# Patient Record
Sex: Female | Born: 1970 | Race: Black or African American | Hispanic: No | Smoking: Never smoker
Health system: Southern US, Community
[De-identification: ages and names within clinical notes are randomized; demographics above are authoritative.]

---

## 1999-11-13 ENCOUNTER — Encounter: Payer: Self-pay | Admitting: Emergency Medicine

## 1999-11-13 ENCOUNTER — Emergency Department (HOSPITAL_COMMUNITY): Admission: EM | Admit: 1999-11-13 | Discharge: 1999-11-13 | Payer: Self-pay | Admitting: Emergency Medicine

## 2006-10-04 ENCOUNTER — Emergency Department (HOSPITAL_COMMUNITY): Admission: EM | Admit: 2006-10-04 | Discharge: 2006-10-04 | Payer: Self-pay | Admitting: Emergency Medicine

## 2006-10-09 ENCOUNTER — Emergency Department (HOSPITAL_COMMUNITY): Admission: EM | Admit: 2006-10-09 | Discharge: 2006-10-09 | Payer: Self-pay | Admitting: Emergency Medicine

## 2007-02-28 ENCOUNTER — Emergency Department (HOSPITAL_COMMUNITY): Admission: RE | Admit: 2007-02-28 | Discharge: 2007-02-28 | Payer: Self-pay | Admitting: Family Medicine

## 2008-04-11 ENCOUNTER — Emergency Department (HOSPITAL_COMMUNITY): Admission: EM | Admit: 2008-04-11 | Discharge: 2008-04-11 | Payer: Self-pay | Admitting: Emergency Medicine

## 2009-01-02 ENCOUNTER — Emergency Department (HOSPITAL_COMMUNITY): Admission: EM | Admit: 2009-01-02 | Discharge: 2009-01-03 | Payer: Self-pay | Admitting: Emergency Medicine

## 2009-04-16 ENCOUNTER — Emergency Department (HOSPITAL_COMMUNITY): Admission: EM | Admit: 2009-04-16 | Discharge: 2009-04-16 | Payer: Self-pay | Admitting: Emergency Medicine

## 2009-10-13 ENCOUNTER — Emergency Department (HOSPITAL_COMMUNITY): Admission: EM | Admit: 2009-10-13 | Discharge: 2009-10-13 | Payer: Self-pay | Admitting: Emergency Medicine

## 2010-04-22 ENCOUNTER — Observation Stay (HOSPITAL_COMMUNITY): Admission: EM | Admit: 2010-04-22 | Discharge: 2010-04-22 | Payer: Self-pay | Admitting: Emergency Medicine

## 2010-04-22 ENCOUNTER — Encounter (INDEPENDENT_AMBULATORY_CARE_PROVIDER_SITE_OTHER): Payer: Self-pay | Admitting: Emergency Medicine

## 2010-04-22 ENCOUNTER — Ambulatory Visit: Payer: Self-pay | Admitting: Vascular Surgery

## 2010-06-15 ENCOUNTER — Ambulatory Visit: Payer: Self-pay | Admitting: Diagnostic Radiology

## 2010-06-15 ENCOUNTER — Emergency Department (HOSPITAL_BASED_OUTPATIENT_CLINIC_OR_DEPARTMENT_OTHER): Admission: EM | Admit: 2010-06-15 | Discharge: 2010-06-15 | Payer: Self-pay | Admitting: Emergency Medicine

## 2010-06-25 ENCOUNTER — Emergency Department (HOSPITAL_BASED_OUTPATIENT_CLINIC_OR_DEPARTMENT_OTHER)
Admission: EM | Admit: 2010-06-25 | Discharge: 2010-06-25 | Payer: Self-pay | Source: Home / Self Care | Admitting: Emergency Medicine

## 2010-06-29 ENCOUNTER — Ambulatory Visit: Payer: Self-pay | Admitting: Cardiology

## 2010-07-07 ENCOUNTER — Telehealth: Payer: Self-pay | Admitting: Cardiology

## 2010-07-19 ENCOUNTER — Ambulatory Visit: Payer: Self-pay | Admitting: Cardiology

## 2010-07-19 ENCOUNTER — Encounter: Payer: Self-pay | Admitting: Cardiology

## 2010-07-19 DIAGNOSIS — I498 Other specified cardiac arrhythmias: Secondary | ICD-10-CM

## 2010-07-19 DIAGNOSIS — R079 Chest pain, unspecified: Secondary | ICD-10-CM | POA: Insufficient documentation

## 2010-08-04 ENCOUNTER — Ambulatory Visit (HOSPITAL_COMMUNITY): Admission: RE | Admit: 2010-08-04 | Discharge: 2010-08-04 | Payer: Self-pay | Admitting: Cardiology

## 2010-08-04 ENCOUNTER — Ambulatory Visit: Payer: Self-pay

## 2010-08-04 ENCOUNTER — Ambulatory Visit: Payer: Self-pay | Admitting: Cardiology

## 2010-08-04 ENCOUNTER — Telehealth: Payer: Self-pay | Admitting: Cardiology

## 2010-08-04 ENCOUNTER — Encounter: Payer: Self-pay | Admitting: Cardiology

## 2010-08-11 ENCOUNTER — Telehealth (INDEPENDENT_AMBULATORY_CARE_PROVIDER_SITE_OTHER): Payer: Self-pay | Admitting: Radiology

## 2010-08-15 ENCOUNTER — Encounter: Payer: Self-pay | Admitting: Cardiovascular Disease

## 2010-08-15 ENCOUNTER — Encounter (HOSPITAL_COMMUNITY)
Admission: RE | Admit: 2010-08-15 | Discharge: 2010-10-25 | Payer: Self-pay | Source: Home / Self Care | Attending: Cardiology | Admitting: Cardiology

## 2010-08-15 ENCOUNTER — Ambulatory Visit: Payer: Self-pay

## 2010-08-15 ENCOUNTER — Ambulatory Visit: Payer: Self-pay | Admitting: Cardiovascular Disease

## 2010-08-25 ENCOUNTER — Telehealth: Payer: Self-pay | Admitting: Cardiology

## 2010-09-01 ENCOUNTER — Emergency Department (HOSPITAL_BASED_OUTPATIENT_CLINIC_OR_DEPARTMENT_OTHER): Admission: EM | Admit: 2010-09-01 | Discharge: 2010-06-24 | Payer: Self-pay | Admitting: Emergency Medicine

## 2010-10-25 NOTE — Assessment & Plan Note (Signed)
Summary: Cardiology Nuclear Testing  Nuclear Med Background Indications for Stress Test: Evaluation for Ischemia, Post Hospital  Indications Comments: Emergency Dept. 9/21,9/30, and 06/25/10 with chest pain. Negative cardiac enzymes.  History: Echo, GXT  History Comments: 08/04/10- Nondiagnostic GXT due to inability to achieve target HR. 08/04/10- Echo- EF= 55-60%  Symptoms: Chest Tightness, DOE, Fatigue, Light-Headedness, Nausea, Palpitations, Vomiting    Nuclear Pre-Procedure Cardiac Risk Factors: Family History - CAD, Obesity, Smoker Caffeine/Decaff Intake: none NPO After: 7:00 PM Lungs: clear IV 0.9% NS with Angio Cath: 22g     IV Site: R Hand Chest Size (in) 36     Cup Size DD     Height (in): 67 Weight (lb): 206 BMI: 32.38  Nuclear Med Study 1 or 2 day study:  1 day     Stress Test Type:  Treadmill/Lexiscan Reading MD:  Marca Ancona, MD     Referring MD:  D.McLean Resting Radionuclide:  Technetium 27m Tetrofosmin     Resting Radionuclide Dose:  11 mCi  Stress Radionuclide:  Technetium 67m Tetrofosmin     Stress Radionuclide Dose:  32 mCi   Stress Protocol  Max Systolic BP: 163 mm Hg   Stress Test Technologist:  Milana Na, EMT-P     Nuclear Technologist:  Domenic Polite, CNMT  Rest Procedure  Myocardial perfusion imaging was performed at rest 45 minutes following the intravenous administration of Technetium 68m Tetrofosmin.  Stress Procedure  The patient received IV Lexiscan 0.4 mg over 15-seconds with concurrent low level exercise and then Technetium 53m Tetrofosmin was injected at 30-seconds while the patient continued walking one more minute.  There were no significant changes with Lexiscan.  Quantitative spect images were obtained after a 45 minute delay.  QPS Raw Data Images:  Normal; no motion artifact; normal heart/lung ratio. Stress Images:  Normal homogeneous uptake in all areas of the myocardium. Rest Images:  Normal homogeneous uptake in all  areas of the myocardium. Subtraction (SDS):  Normal Transient Ischemic Dilatation:  1.05  (Normal <1.22)  Lung/Heart Ratio:  0.27  (Normal <0.45)  Quantitative Gated Spect Images QGS EDV:  96 ml QGS ESV:  34 ml QGS EF:  65 % QGS cine images:  normal  Findings Normal nuclear study      Overall Impression  Exercise Capacity: Lexiscan with no exercise. BP Response: Normal blood pressure response. Clinical Symptoms: Dyspnea ECG Impression: No significant ST segment change suggestive of ischemia. Overall Impression: Normal stress nuclear study.  Appended Document: Cardiology Nuclear Testing normal study  Appended Document: Cardiology Nuclear Testing [facility]   Appended Document: Cardiology Nuclear Testing discussed results with pt by telephone--see phone note 08/25/10

## 2010-10-25 NOTE — Progress Notes (Signed)
Summary: Nuc Pre-Procedure  Phone Note Outgoing Call Call back at Home Phone [phone]   Call placed by: Henrine Screws Call placed to: Patient Reason for Call: Confirm/change Appt Summary of Call: Left message with information on Myoview Information Sheet (see scanned document for details).      Nuclear Med Background Indications for Stress Test: Evaluation for Ischemia, Post Hospital  Indications Comments: Emergency Dept. 9/21,9/30, and 06/25/10 with chest pain. Negative cardiac enzymes.  History: Echo, GXT  History Comments: 08/04/10- Nondiagnostic GXT due to inability to achieve target HR. 08/04/10- Echo- EF= 55-60%  Symptoms: Chest Tightness, DOE, Fatigue, Light-Headedness, Nausea, Palpitations, Vomiting    Nuclear Pre-Procedure Cardiac Risk Factors: Family History - CAD, Obesity, Smoker Height (in): 67

## 2010-10-25 NOTE — Progress Notes (Signed)
Summary: monitor results 06/29/10  Phone Note Outgoing Call   Call placed by: Katina Dung, RN, BSN,  July 07, 2010 4:16 PM Call placed to: Patient Summary of Call: monitor results  Follow-up for Phone Call        Dr Shirlee Latch reviewed monitor done 06/29/10(signed monitor report is on Anne Lankford's desk)-Dr McLean's interpretation-no worrisome events,HR in 40s at lowest--OK --[facility] Katina Dung, RN, BSN  July 07, 2010 4:17 PM I called [phone] and talked with pt's mother-this is not pt's phone number--she did not have pt's number but stated she would ask pt to call our office Katina Dung, RN, BSN  July 07, 2010 6:11 PM   Additional Follow-up for Phone Call Additional follow up Details #1::        pt returning call re results of monitor-pls call [phone] Glynda Jaeger  July 08, 2010 8:36 AM  Meredith Staggers, RN  July 08, 2010 9:02 AM   Pt. still having some CP & is extremely tired & is C/O headaches more frequently. Was seen in the ER for CP with negative CE. According to the pt, her HR drops when she stands up. Holter monitor showed lowest HR in the 40's. Does not have a f/u appt with Dr.Mclean. Will forward to his desktop to see if he wants her to f/u with him or primary. Whitney Maeola Sarah RN  July 08, 2010 9:30 AM  Additional Follow-up by: Whitney Maeola Sarah RN,  July 08, 2010 9:31 AM     Appended Document: monitor results 06/29/10 She can come for appointment with me.   Appended Document: monitor results 06/29/10 Left message with family member for pt to call & make an appt.   Appended Document: monitor results 06/29/10 pt's appt 10/25 with Mclean

## 2010-10-25 NOTE — Procedures (Signed)
Summary: summary report  summary report   Imported By: Mirna Mires 07/18/2010 16:08:53  _____________________________________________________________________  External Attachment:    Type:   Image     Comment:   External Document

## 2010-10-25 NOTE — Progress Notes (Signed)
Summary: pt rtn call  Phone Note Call from Patient Call back at [phone]   Caller: Patient Reason for Call: Talk to Nurse, Talk to Doctor Summary of Call: pt rtn call not sure who called or why Initial call taken by: Omer Jack,  August 25, 2010 8:23 AM  Follow-up for Phone Call        talked with pt about myoview results--pt states she continues to have chest pain--Dr Shirlee Latch reviewed echo and  myoview results-pt had chest xray 05/2010 at [facility]---he recommended pt try Omeprazole 20mg  and followup with her PCP--pt states she has a prescription for Omeprazole and has been using this daily--this does not seem to help her chest pain--pt states she is going to have to change her PCP--we dicussed referral to [facility] --pt has [employer] --apparently pt has to go through [facility] for primary care MD--I gave pt the phone number for [facility] primary care(Dr [employer]) so that when she was ready she could call and get an appt

## 2010-10-25 NOTE — Progress Notes (Signed)
Summary: schedule lexiscan myoview  Phone Note Outgoing Call   Call placed by: Katina Dung, RN, BSN,  August 04, 2010 12:24 PM Call placed to: Patient Summary of Call: schedule lexiscan myoview  Follow-up for Phone Call        per Dr Tempie Donning lexiscan Celine Ahr

## 2010-10-25 NOTE — Assessment & Plan Note (Signed)
Summary: np6/post moniter results/ok per note in EMR/lg   Visit Type:  new pt visit Primary Bernadean Saling:  [facility]  CC:  chest tightness/pain....sob....edema/feet at times...pt states she has been sick recentlylike a virus with lightheadness, n/v, and .  History of Present Illness: 40 yo with history of episodes of chest pain with 3 recent ER visits presents for evaluation.  Patient has felt sick and fatigued in general for 3-4 weeks now.  She has heart palpitations and some lightheadedness with standing.  She was seen in the ER 9/21, 9/30, and 10/1 for chest pain.  According to ER notes, the pain was reproducible with palpation.  She says that the pain is across her upper chest and while it comes and goes, it can last for up to a day at a time.  Her visits to the emergency department have yielded normal cardiac enzymes, negative D dimer, normal ECG, and normal CXR.  She was told once in the ER that she was bradycardic (heart rate appears to have been in the 50s).  She had a holter done that showed reasonable chronotropy.  Patient is worried about the chest pain and fatigue.   ECG: NSR, PACs  Labs (10/11): K 4.2, creatinine 1, TSH normal, D dimer normal, CEs negative   Current Medications (verified): 1)  Nicotine 21 Mg/24hr Pt24 (Nicotine) .... Change Patch Daily 2)  Ibuprofen 200 Mg Caps (Ibuprofen) .... As Needed  Allergies: 1)  ! Pcn 2)  ! Sulfa  Past History:  Past Medical History: 1. Chest pain 2. Active smoker 3. h/o tubal ligation 4. Palpitations: Holter monitor (10/11) with no worrisome events.  HR ranged 47-115 average 68.  Rare PACs.  No significant bradycardia.   Family History: Father with "heart trouble" (not sure exact details)  Social History: Reviewed history from 07/18/2010 and no changes required. Tobacco Use - Yes.  Alcohol Use - yes-occasional Drug Use - no  Review of Systems       All systems reviewed and negative except as per HPI.   Vital  Signs:  Patient profile:   40 year old female Height:      67 inches Weight:      211 pounds BMI:     33.17 Pulse rate:   60 / minute Pulse rhythm:   irregular BP sitting:   108 / 70  (left arm) Cuff size:   large  Vitals Entered By: Danielle Rankin, CMA (July 19, 2010 11:55 AM)  Physical Exam  General:  Well developed, well nourished, in no acute distress. Head:  normocephalic and atraumatic Nose:  no deformity, discharge, inflammation, or lesions Mouth:  Teeth, gums and palate normal. Oral mucosa normal. Neck:  Neck supple, no JVD. No masses, thyromegaly or abnormal cervical nodes. Lungs:  Clear bilaterally to auscultation and percussion. Heart:  Non-displaced PMI, chest non-tender; regular rate and rhythm, S1, S2 without murmurs, rubs or gallops. Carotid upstroke normal, no bruit. Pedals normal pulses. No edema, no varicosities. Abdomen:  Bowel sounds positive; abdomen soft and non-tender without masses, organomegaly, or hernias noted. No hepatosplenomegaly. Msk:  Back normal, normal gait. Muscle strength and tone normal. Extremities:  No clubbing or cyanosis. Neurologic:  Alert and oriented x 3. Skin:  Intact without lesions or rashes. Psych:  Normal affect.   Impression & Recommendations:  Problem # 1:  CHEST PAIN (ICD-786.50) Patient reports atypical chest pain and fatigue.  She has had 3 recent ER visits for these symptoms. I suspect this is noncardiac.  Given her concern over her heart rate as well as the chest pain, I will get an ETT to assess for ischemia but also to assess HR response to exercise.  I will get an echo to make sure that the heart is structurally normal.   Problem # 2:  BRADYCARDIA (ICD-427.89) No significant bradycardia on holter.  As above, will walk patient on treadmill and to see chronotropic response.   Other Orders: Treadmill (Treadmill) Echocardiogram (Echo)  Patient Instructions: 1)  Your physician has requested that you have an exercise  tolerance test.  For further information please visit [email or web address]  Please also follow instruction sheet, as given. 2)  Your physician has requested that you have an echocardiogram.  Echocardiography is a painless test that uses sound waves to create images of your heart. It provides your doctor with information about the size and shape of your heart and how well your heart's chambers and valves are working.  This procedure takes approximately one hour. There are no restrictions for this procedure.

## 2010-12-08 LAB — CBC
HCT: 43.1 % (ref 36.0–46.0)
Hemoglobin: 14.5 g/dL (ref 12.0–15.0)
MCH: 27.1 pg (ref 26.0–34.0)
MCV: 80.5 fL (ref 78.0–100.0)
MCV: 81.5 fL (ref 78.0–100.0)
Platelets: 203 10*3/uL (ref 150–400)
Platelets: 212 10*3/uL (ref 150–400)
RBC: 5.33 MIL/uL — ABNORMAL HIGH (ref 3.87–5.11)
RBC: 5.35 MIL/uL — ABNORMAL HIGH (ref 3.87–5.11)
RBC: 5.54 MIL/uL — ABNORMAL HIGH (ref 3.87–5.11)
RDW: 13.2 % (ref 11.5–15.5)
WBC: 7.9 10*3/uL (ref 4.0–10.5)
WBC: 8.8 10*3/uL (ref 4.0–10.5)

## 2010-12-08 LAB — DIFFERENTIAL
Basophils Absolute: 0.2 10*3/uL — ABNORMAL HIGH (ref 0.0–0.1)
Basophils Relative: 1 % (ref 0–1)
Basophils Relative: 2 % — ABNORMAL HIGH (ref 0–1)
Eosinophils Absolute: 0.2 10*3/uL (ref 0.0–0.7)
Eosinophils Absolute: 0.3 10*3/uL (ref 0.0–0.7)
Eosinophils Relative: 4 % (ref 0–5)
Lymphocytes Relative: 64 % — ABNORMAL HIGH (ref 12–46)
Lymphs Abs: 3.8 10*3/uL (ref 0.7–4.0)
Lymphs Abs: 4.1 10*3/uL — ABNORMAL HIGH (ref 0.7–4.0)
Lymphs Abs: 5.7 10*3/uL — ABNORMAL HIGH (ref 0.7–4.0)
Monocytes Absolute: 0.5 10*3/uL (ref 0.1–1.0)
Monocytes Relative: 6 % (ref 3–12)
Neutro Abs: 2.1 10*3/uL (ref 1.7–7.7)
Neutro Abs: 2.2 10*3/uL (ref 1.7–7.7)
Neutrophils Relative %: 31 % — ABNORMAL LOW (ref 43–77)
Neutrophils Relative %: 37 % — ABNORMAL LOW (ref 43–77)

## 2010-12-08 LAB — BASIC METABOLIC PANEL
Calcium: 9.1 mg/dL (ref 8.4–10.5)
Chloride: 110 mEq/L (ref 96–112)
Creatinine, Ser: 1 mg/dL (ref 0.4–1.2)
GFR calc Af Amer: >60 mL/min (ref >60)
GFR calc Af Amer: >60 mL/min (ref >60)
GFR calc non Af Amer: >60 mL/min (ref >60)
Potassium: 3.9 mEq/L (ref 3.5–5.1)
Sodium: 143 mEq/L (ref 135–145)
Sodium: 141 mEq/L (ref 135–145)

## 2010-12-08 LAB — COMPREHENSIVE METABOLIC PANEL
ALT: 18 U/L (ref 0–35)
CO2: 24 mEq/L (ref 19–32)
Calcium: 9 mg/dL (ref 8.4–10.5)
GFR calc non Af Amer: >60 mL/min (ref >60)
Glucose, Bld: 91 mg/dL (ref 70–99)
Sodium: 140 mEq/L (ref 135–145)

## 2010-12-08 LAB — POCT CARDIAC MARKERS
CKMB, poc: <1 ng/mL — ABNORMAL LOW (ref 1.0–8.0)
CKMB, poc: <1 ng/mL — ABNORMAL LOW (ref 1.0–8.0)
CKMB, poc: <1 ng/mL — ABNORMAL LOW (ref 1.0–8.0)
Myoglobin, poc: 40.5 ng/mL (ref 12–200)
Myoglobin, poc: 32.6 ng/mL (ref 12–200)
Myoglobin, poc: 41.6 ng/mL (ref 12–200)
Troponin i, poc: <0.05 ng/mL (ref 0.00–0.09)
Troponin i, poc: <0.05 ng/mL (ref 0.00–0.09)
Troponin i, poc: <0.05 ng/mL (ref 0.00–0.09)

## 2010-12-08 LAB — LIPASE, BLOOD: Lipase: 46 U/L (ref 23–300)

## 2010-12-08 LAB — D-DIMER, QUANTITATIVE: D-Dimer, Quant: <0.22 ug/mL-FEU (ref 0.00–0.48)

## 2010-12-10 LAB — APTT: aPTT: 32 seconds (ref 24–37)

## 2010-12-10 LAB — POCT I-STAT, CHEM 8
BUN: <3 mg/dL — ABNORMAL LOW (ref 6–23)
Creatinine, Ser: 1.1 mg/dL (ref 0.4–1.2)
HCT: 43 % (ref 36.0–46.0)
Hemoglobin: 14.6 g/dL (ref 12.0–15.0)

## 2010-12-10 LAB — CBC
HCT: 41.4 % (ref 36.0–46.0)
MCH: 28 pg (ref 26.0–34.0)
MCV: 81.7 fL (ref 78.0–100.0)
Platelets: 211 10*3/uL (ref 150–400)
RBC: 5.07 MIL/uL (ref 3.87–5.11)

## 2010-12-10 LAB — PROTIME-INR: INR: 1 (ref 0.00–1.49)

## 2011-07-13 LAB — WET PREP, GENITAL: Trich, Wet Prep: NONE SEEN

## 2011-07-13 LAB — POCT URINALYSIS DIP (DEVICE)
Nitrite: NEGATIVE
Urobilinogen, UA: 1
pH: 5.5

## 2011-09-04 ENCOUNTER — Emergency Department (HOSPITAL_COMMUNITY)
Admission: EM | Admit: 2011-09-04 | Discharge: 2011-09-04 | Discharge status: Against Medical Advice | Payer: Self-pay | Attending: Emergency Medicine | Admitting: Emergency Medicine

## 2011-09-04 DIAGNOSIS — Z0389 Encounter for observation for other suspected diseases and conditions ruled out: Secondary | ICD-10-CM | POA: Insufficient documentation

## 2011-09-04 NOTE — ED Notes (Signed)
No answer x 3 by Roselyn Bering EMT

## 2011-09-04 NOTE — ED Notes (Signed)
Patient  Was placed in Peds waiting room , attempted to talk to patient was verbally abusive when ask what was going on with her today she stated. ":I have already told the fucking police and firemen I am not telling anyone else. ". Went to get patient to bring back to a treatment room and patient wasn't there. Unable to find patient in department.

## 2013-05-18 ENCOUNTER — Emergency Department (HOSPITAL_COMMUNITY)
Admission: EM | Admit: 2013-05-18 | Discharge: 2013-05-18 | Disposition: A | Discharge status: Home / Self Care | Payer: Self-pay | Attending: Emergency Medicine | Admitting: Emergency Medicine

## 2013-05-18 ENCOUNTER — Encounter (HOSPITAL_COMMUNITY): Payer: Self-pay | Admitting: Emergency Medicine

## 2013-05-18 DIAGNOSIS — K089 Disorder of teeth and supporting structures, unspecified: Secondary | ICD-10-CM | POA: Insufficient documentation

## 2013-05-18 DIAGNOSIS — Z880 Allergy status to penicillin: Secondary | ICD-10-CM | POA: Insufficient documentation

## 2013-05-18 DIAGNOSIS — K0889 Other specified disorders of teeth and supporting structures: Secondary | ICD-10-CM

## 2013-05-18 MED ORDER — HYDROCODONE-ACETAMINOPHEN 5-325 MG PO TABS: 1.0000 | ORAL_TABLET | ORAL | Status: DC | PRN

## 2013-05-18 MED ORDER — CLINDAMYCIN HCL 150 MG PO CAPS: 150.0000 mg | ORAL_CAPSULE | Freq: Three times a day (TID) | ORAL | Status: DC

## 2013-05-18 NOTE — ED Notes (Signed)
Pt c/o dental pain since this morning on rt side of jaw.

## 2013-05-18 NOTE — ED Provider Notes (Signed)
Medical screening examination/treatment/procedure(s) were performed by non-physician practitioner and as supervising physician I was immediately available for consultation/collaboration.   Dshawn Mcnay, MD 05/18/13 1340 

## 2013-05-18 NOTE — ED Notes (Signed)
rn started to review discharge instructions. Pt asked if she could receive pain medicine while at the hospital. rn said she would ask the PA, but that if pt was driving she would not be given a strong medication. rn asked PA, PA did not order pain medication, PA gave pt rx for pain medication. Pt then asked to see the doctor. rn said she would go ask, but if PA was busy then PA might not be able to come talk to her. rn asked PA, PA said she was busy. rn came back to room and told pt PA said she was busy. Pt began to get upset. Called a phone number. rn got charge nurse to come to room. Charge explained that if the PA did not order medication nothing could be done. rn left room. Charge rn still in room with pt. Finishing discharging pt.

## 2013-05-18 NOTE — ED Notes (Signed)
PA at bedside Pt alert and oriented x4. Respirations even and unlabored, bilateral symmetrical rise and fall of chest. Skin warm and dry. In no acute distress. Denies needs.   

## 2013-05-18 NOTE — ED Provider Notes (Signed)
  CSN: [id number]     Arrival date & time 05/18/13  5784 History     First MD Initiated Contact with Patient 05/18/13 [phone]     Chief Complaint  Patient presents with  . Dental Pain   (Consider location/radiation/quality/duration/timing/severity/associated sxs/prior Treatment) HPI Comments: Pt states that she cracked her right lower tooth a couple of weeks ago and now she is having a lot of pain to the whole right side of her face today:pt denies history of similar problems:pt states that she can't afford a dentist:denies fever or swelling:pt has not tried anything at home  The history is provided by the patient.    History reviewed. No pertinent past medical history. No past surgical history on file. No family history on file. History  Substance Use Topics  . Smoking status: Never Smoker   . Smokeless tobacco: Not on file  . Alcohol Use: No   OB History   Grav Para Term Preterm Abortions TAB SAB Ect Mult Living                 Review of Systems  Respiratory: Negative.   Cardiovascular: Negative.     Allergies  Penicillins and Sulfonamide derivatives  Home Medications  No current outpatient prescriptions on file. BP 154/93  Pulse 67  Temp(Src) 98.2 F (36.8 C) (Oral)  Resp 16  SpO2 100% Physical Exam  Nursing note and vitals reviewed. Constitutional: She appears well-developed and well-nourished.  HENT:  Right Ear: External ear normal.  Left Ear: External ear normal.  Mouth/Throat:    Neck: Normal range of motion. Neck supple.  Cardiovascular: Normal rate and regular rhythm.   Pulmonary/Chest: Effort normal and breath sounds normal.    ED Course   Procedures (including critical care time)  Labs Reviewed - No data to display No results found. 1. Toothache     MDM  Pt treated with clinda and hydrocodone and given dental referral  Teressa Lower, NP 05/18/13 [id number]

## 2013-05-23 ENCOUNTER — Telehealth (HOSPITAL_COMMUNITY): Payer: Self-pay | Admitting: Emergency Medicine

## 2013-05-23 NOTE — ED Notes (Signed)
Pt called states she is having trouble with antibiotic- causing n/v/and increase pain in tooth. States she has trouble with all antibiotics. Advised to return for re-evaluation.

## 2016-02-18 ENCOUNTER — Encounter (HOSPITAL_COMMUNITY): Payer: Self-pay | Admitting: Emergency Medicine

## 2016-02-18 ENCOUNTER — Emergency Department (HOSPITAL_COMMUNITY)
Admission: EM | Admit: 2016-02-18 | Discharge: 2016-02-18 | Disposition: A | Discharge status: Home / Self Care | Payer: Self-pay | Attending: Emergency Medicine | Admitting: Emergency Medicine

## 2016-02-18 DIAGNOSIS — K029 Dental caries, unspecified: Secondary | ICD-10-CM | POA: Insufficient documentation

## 2016-02-18 DIAGNOSIS — Z79891 Long term (current) use of opiate analgesic: Secondary | ICD-10-CM | POA: Insufficient documentation

## 2016-02-18 MED ORDER — CLINDAMYCIN HCL 150 MG PO CAPS: 150.0000 mg | ORAL_CAPSULE | Freq: Three times a day (TID) | ORAL | Status: DC

## 2016-02-18 MED ORDER — ACETAMINOPHEN-CODEINE #3 300-30 MG PO TABS: 1.0000 | ORAL_TABLET | Freq: Four times a day (QID) | ORAL | Status: DC | PRN

## 2016-02-18 MED ORDER — ACETAMINOPHEN-CODEINE #3 300-30 MG PO TABS: 1.0000 | ORAL_TABLET | Freq: Four times a day (QID) | ORAL | Status: AC | PRN

## 2016-02-18 MED ORDER — BUPIVACAINE-EPINEPHRINE (PF) 0.5% -1:200000 IJ SOLN
1.8000 mL | Freq: Once | INTRAMUSCULAR | Status: AC
  Administered 2016-02-18: 1.8 mL
  Filled 2016-02-18: qty 1.8

## 2016-02-18 NOTE — Discharge Instructions (Signed)
Dental Caries °Dental caries is tooth decay. This decay can cause a hole in teeth (cavity) that can get bigger and deeper over time. °HOME CARE °· Brush and floss your teeth. Do this at least two times a day. °· Use a fluoride toothpaste. °· Use a mouth rinse if told by your dentist or doctor. °· Eat less sugary and starchy foods. Drink less sugary drinks. °· Avoid snacking often on sugary and starchy foods. Avoid sipping often on sugary drinks. °· Keep regular checkups and cleanings with your dentist. °· Use fluoride supplements if told by your dentist or doctor. °· Allow fluoride to be applied to teeth if told by your dentist or doctor. °  °This information is not intended to replace advice given to you by your health care provider. Make sure you discuss any questions you have with your health care provider. °  °Document Released: 06/20/2008 Document Revised: 10/02/2014 Document Reviewed: 09/13/2012 °Elsevier Interactive Patient Education ©2016 Elsevier Inc. °Community Resource Guide Dental °The United Way’s “211” is a great source of information about community services available.  Access by dialing 2-1-1 from anywhere in [place], or by website -  www.nc211.org.  ° °Other Local Resources (Updated 09/2015) ° °Dental  Care °  °Services ° °  °Phone Number and Address  °Cost  °[facility] For children 0 - 21 years of age:  °• Cleaning °• Tooth brushing/flossing instruction °• Sealants, fillings, crowns °• Extractions °• Emergency treatment  [phone] °[address] °[place], [place] [address] Charges based on family income.  Medicaid and some insurance plans accepted.   °  °[facility] • Cleaning °• Sealants, fillings, crowns °• Extractions °• Emergency treatment [phone] °[address] °[place], [place] ° Pregnant women 18 years of age or older with a Medicaid card  °[facility] [place] • Cleaning °• Sealants, fillings, crowns °• Extractions °• Emergency treatment [phone] °[address] °[place], [place] Pregnant women 18 years of age or older with a Medicaid card  °[facility] For children 0 - 21 years of age:  °• Cleaning °• Tooth brushing/flossing instruction °• Sealants, fillings, crowns °• Extractions °• Emergency treatment °Limited orthodontic services for patients with Medicaid [phone] °[address] °, [place] [address] Medicaid and [place] Health Choice cover for children up to age 21 and pregnant women.  Parents of children up to age 21 without Medicaid pay a reduced fee at time of service.  °[facility] [place] For children 0 - 21 years of age:  °• Cleaning °• Tooth brushing/flossing instruction °• Sealants, fillings, crowns °• Extractions °• Emergency treatment °Limited orthodontic services for patients with Medicaid [phone] °[address] °[place], [place].  Medicaid and [place] Health Choice cover for children up to age 21 and pregnant women.  Parents of children up to age 21 without Medicaid pay a reduced fee.  °[facility] • Cleaning °• Sealants, fillings, crowns °• Extractions ° °Hours: Tuesdays and Thursdays, 4:15 - 8 pm [phone] °[address] °[place],  [address] Services free of charge to [place] residents ages 18-64 who do not have health insurance, Medicare, Medicaid, or VA benefits and fall within federal poverty guidelines  °[facility] ° ° ° Provides dental care in addition to primary medical care, nutritional counseling,   and pharmacy: °• Cleaning °• Sealants, fillings, crowns °• Extractions ° ° ° ° ° ° ° ° ° ° ° ° ° ° ° ° ° [phone] °[facility], [address] °[place], [place] ° °[phone] °[facility], [address] [place], [place] ° °[phone] °[facility] °[place], [place] ° °[phone] °[facility], [address] °[place], [place] ° °[phone] °[facility] °[address] °[place], [place] Accepts Medicaid, Medicare, most insurance.  Also provides services available to all with fees adjusted based on ability to pay.    °[facility] • Cleaning °• Tooth brushing/flossing instruction °• Sealants, fillings, crowns °• Extractions °• Emergency treatment °Hours: Tuesdays, Thursdays, and Fridays from 8 am to 5 pm by appointment only. [phone] °[address] °[place], [place] [address] [place] residents with Medicaid (depending on eligibility) and children with [place] Health Choice - call for more information.  °[facility] • Extractions only ° °Hours: 2nd and 4th Thursday of each month from 6:30 am - 9 am.   [phone] ext. [phone] °[address] °[place], [place] [address] Ages 18 and older only.  Patients are seen on a first come, first served basis.  °[facility] • Cleanings °• Fillings °• Extractions °• Orthodontics °• Endodontics °• Implants/Crowns/Bridges °• Complete and partial dentures [phone] °[place], [place] Patients must complete an application for services.  There is often a waiting list.   ° °

## 2016-02-18 NOTE — ED Notes (Signed)
Pt c/o left lower molar pain intermittent x several months, worsened yesterday.

## 2016-02-18 NOTE — ED Provider Notes (Signed)
CSN: [id number]     Arrival date & time 02/18/16  1913 History  By signing my name below, I, Placido SouLogan Joldersma, attest that this documentation has been prepared under the direction and in the presence of Fayrene HelperBowie Abryanna Musolino, PA-C. Electronically Signed: Placido SouLogan Joldersma, ED Scribe. 02/18/2016. 7:19 PM.   Chief Complaint  Patient presents with  . Dental Pain   The history is provided by the patient. No language interpreter was used.    HPI Comments: Adrienne Fletcher is a 45 y.o. female who is otherwise healthy presents to the Emergency Department complaining of worsening, moderate, dull/throbbing right sided upper and lower jaw pain x 1 day. Pt states that she has a broken tooth in the region and denies having seen a dental provider for years. She reports associated, mild, right neck swelling and states her pain worsens with palpation, jaw movement or cold food/drink. Her pain radiates to her right ear and right neck noting that her right ear will intermittently "pop". She took 800 mg ibuprofen with her last dose ~4 hours ago without significant relief. She has allergies to penicillin and sulfa drugs which she states cause rashes and swelling. She denies SOB, fever, hearing loss, tinnitus and chills.   History reviewed. No pertinent past medical history. History reviewed. No pertinent past surgical history. History reviewed. No pertinent family history. Social History  Substance Use Topics  . Smoking status: Never Smoker   . Smokeless tobacco: None  . Alcohol Use: No   OB History    No data available     Review of Systems  Constitutional: Negative for fever and chills.  HENT: Positive for dental problem. Negative for hearing loss and tinnitus.   Respiratory: Negative for shortness of breath.     Allergies  Penicillins and Sulfonamide derivatives  Home Medications   Prior to Admission medications   Medication Sig Start Date End Date Taking? Authorizing Provider  clindamycin (CLEOCIN) 150 MG  capsule Take 1 capsule (150 mg total) by mouth 3 (three) times daily. 05/18/13   Teressa LowerVrinda Pickering, NP  HYDROcodone-acetaminophen (NORCO/VICODIN) 5-325 MG per tablet Take 1 tablet by mouth every 4 (four) hours as needed for pain. 05/18/13   Teressa LowerVrinda Pickering, NP   BP 129/76 mmHg  Pulse 71  Temp(Src) 99 F (37.2 C) (Oral)  Resp 16  SpO2 95%    Physical Exam  Constitutional: She is oriented to person, place, and time. She appears well-developed and well-nourished. She appears distressed.  African American female sitting upright, speaking in complete sentences and tearful upon examination due to obvious pain  HENT:  Head: Normocephalic and atraumatic.  Right Ear: Tympanic membrane is not perforated and not erythematous.  Right ear canal mildly erythematous; significant dental decay noted to tooth number 29; TTP without obvious abscess for drainage; no trismus; no facial swelling  Eyes: EOM are normal.  Neck: Normal range of motion. Neck supple.  Cardiovascular: Normal rate.   Pulmonary/Chest: Effort normal. No respiratory distress.  Abdominal: Soft.  Musculoskeletal: Normal range of motion.  Lymphadenopathy:    She has cervical adenopathy (right anterior).  Neurological: She is alert and oriented to person, place, and time.  Skin: Skin is warm and dry. She is not diaphoretic.  Psychiatric: She has a normal mood and affect.  Nursing note and vitals reviewed.   ED Course  .Nerve Block Date/Time: 02/18/2016 8:52 PM Performed by: Fayrene HelperRAN, Leana Springston Authorized by: Fayrene HelperRAN, Jefferie Holston Consent: Verbal consent obtained. Risks and benefits: risks, benefits and alternatives were discussed Consent  given by: patient Patient understanding: patient states understanding of the procedure being performed Patient consent: the patient's understanding of the procedure matches consent given Patient identity confirmed: verbally with patient Time out: Immediately prior to procedure a "time out" was called to verify the  correct patient, procedure, equipment, support staff and site/side marked as required. Indications: pain relief Body area: face/mouth Nerve: inferior alveolar Laterality: right Patient sedated: no Patient position: sitting Needle gauge: 27 G Location technique: anatomical landmarks Local anesthetic: bupivacaine 0.5% with epinephrine Anesthetic total: 1.8 ml Outcome: pain improved    DIAGNOSTIC STUDIES: Oxygen Saturation is 95% on RA, adequate by my interpretation.    COORDINATION OF CARE: 7:56 PM Pt presents today with dental pain which worsened beginning yesterday. Discussed next steps with pt including a dental block. Pt verbalized understanding and is agreeable with the plan.     MDM   Final diagnoses:  Pain due to dental caries    Patient with dentalgia.  No abscess requiring immediate incision and drainage.  Exam not concerning for Ludwig's angina or pharyngeal abscess.  Will treat with a dental block and d/c with tylenol #3 and clindamycin. Pt instructed to follow-up with dentist.  Discussed return precautions. Pt safe for discharge.  BP 129/76 mmHg  Pulse 71  Temp(Src) 99 F (37.2 C) (Oral)  Resp 16  SpO2 95%   I personally performed the services described in this documentation, which was scribed in my presence. The recorded information has been reviewed and is accurate.      Fayrene Helper, PA-C 02/18/16 2053  Linwood Dibbles, MD 02/19/16 [phone]

## 2016-02-24 ENCOUNTER — Encounter (HOSPITAL_BASED_OUTPATIENT_CLINIC_OR_DEPARTMENT_OTHER): Payer: Self-pay | Admitting: Emergency Medicine

## 2016-02-24 ENCOUNTER — Emergency Department (HOSPITAL_BASED_OUTPATIENT_CLINIC_OR_DEPARTMENT_OTHER)
Admission: EM | Admit: 2016-02-24 | Discharge: 2016-02-24 | Disposition: A | Discharge status: Home / Self Care | Payer: Self-pay | Attending: Emergency Medicine | Admitting: Emergency Medicine

## 2016-02-24 DIAGNOSIS — K0889 Other specified disorders of teeth and supporting structures: Secondary | ICD-10-CM | POA: Insufficient documentation

## 2016-02-24 MED ORDER — HYDROCODONE-ACETAMINOPHEN 5-325 MG PO TABS: 1.0000 | ORAL_TABLET | Freq: Four times a day (QID) | ORAL | Status: AC | PRN

## 2016-02-24 MED ORDER — CLINDAMYCIN HCL 300 MG PO CAPS: 300.0000 mg | ORAL_CAPSULE | Freq: Four times a day (QID) | ORAL | Status: AC

## 2016-02-24 NOTE — ED Notes (Signed)
Pt c/o dental pain. Was seen last week for same and has finished abx. Pt states she has been unable to find a dentist.

## 2016-02-24 NOTE — ED Notes (Signed)
Pt states she has called all the dentists on the dental resource guide given to her last week and none of them can see her. Pt given number to [facility] in addition to referral dentist number.

## 2016-02-24 NOTE — ED Provider Notes (Signed)
CSN: [id number]     Arrival date & time 02/24/16  1939 History  By signing my name below, I, Majel Homer, attest that this documentation has been prepared under the direction and in the presence of Geoffery Lyons, MD . Electronically Signed: Majel Homer, Scribe. 02/24/2016. 8:02 PM.     Chief Complaint  Patient presents with  . Dental Pain   Patient is a 45 y.o. female presenting with tooth pain. The history is provided by the patient. No language interpreter was used.  Dental Pain Location:  Lower Severity:  Moderate Onset quality:  Gradual Duration:  2 weeks Timing:  Constant Progression:  Worsening Context: abscess, dental caries and poor dentition   Previous work-up:  Dental exam Relieved by:  Nothing Worsened by:  Nothing tried Ineffective treatments:  None tried Associated symptoms: no fever and no trismus   Risk factors: lack of dental care    HPI Comments:  Adrienne Fletcher is a 45 y.o. female who presents to the Emergency Department complaining of gradual onset, gradually worsening, moderate lower right dental pain onset 2 weeks ago. Pt states that she was seen in the ED a week ago for the same symptom but has finished the clindamycin prescribed to alleviate her pain with no relief. Pt notes that an abscess appeared a few days after she left the ED. Pt has not been able to follow up with a dentist due to age. Pt denies any other complaints.  History reviewed. No pertinent past medical history. History reviewed. No pertinent past surgical history. No family history on file. Social History  Substance Use Topics  . Smoking status: Never Smoker   . Smokeless tobacco: None  . Alcohol Use: No   OB History    No data available     Review of Systems  Constitutional: Negative for fever.    10 systems reviewed and all are negative for acute change except as noted in the HPI.  Allergies  Penicillins and Sulfonamide derivatives  Home Medications   Prior to Admission medications    Medication Sig Start Date End Date Taking? Authorizing Provider  acetaminophen-codeine (TYLENOL #3) 300-30 MG tablet Take 1-2 tablets by mouth every 6 (six) hours as needed for moderate pain. 02/18/16   Fayrene Helper, PA-C  clindamycin (CLEOCIN) 150 MG capsule Take 1 capsule (150 mg total) by mouth 3 (three) times daily. 02/18/16   Fayrene Helper, PA-C  HYDROcodone-acetaminophen (NORCO/VICODIN) 5-325 MG per tablet Take 1 tablet by mouth every 4 (four) hours as needed for pain. 05/18/13   Teressa Lower, NP   Triage Vitals: BP 119/70 mmHg  Pulse 71  Temp(Src) 98.3 F (36.8 C) (Oral)  Resp 18  Ht  (1.702 m)  Wt 206 lb (93.441 kg)  BMI 32.26 kg/m2  SpO2 100% Physical Exam  Constitutional: She is oriented to person, place, and time. She appears well-developed and well-nourished.  HENT:  Head: Normocephalic and atraumatic.  Tenderness to palpation of the right lower mandible.  Heavily decayed teeth to the right lower jaw.  No trismus or crepitus to the submental space.   Eyes: Conjunctivae are normal. Right eye exhibits no discharge. Left eye exhibits no discharge.  Neck: Normal range of motion. Neck supple. No tracheal deviation present.  Cardiovascular: Normal rate and regular rhythm.   Pulmonary/Chest: Effort normal and breath sounds normal.  Abdominal: Soft. She exhibits no distension. There is no tenderness. There is no guarding.  Musculoskeletal: She exhibits no edema.  Neurological: She is alert and  oriented to person, place, and time.  Skin: Skin is warm. No rash noted.  Psychiatric: She has a normal mood and affect.  Nursing note and vitals reviewed.   ED Course  Procedures  DIAGNOSTIC STUDIES:  Oxygen Saturation is 100% on RA, normal by my interpretation.    COORDINATION OF CARE:  7:58 PM Discussed treatment plan, which includes follow-up with dentist on-call with pt at bedside and pt agreed to plan.  Labs Review Labs Reviewed - No data to display  Imaging Review No  results found. I have personally reviewed and evaluated these images and lab results as part of my medical decision-making.   EKG Interpretation None      MDM   Final diagnoses:  None    Will continue the course of clindamycin, prescribe more pain medication, and give the patient additional information about dental resources.  I personally performed the services described in this documentation, which was scribed in my presence. The recorded information has been reviewed and is accurate.       Geoffery Lyonsouglas Ryenne Lynam, MD 02/24/16 [phone]

## 2018-07-20 ENCOUNTER — Emergency Department (HOSPITAL_BASED_OUTPATIENT_CLINIC_OR_DEPARTMENT_OTHER): Payer: Self-pay

## 2018-07-20 ENCOUNTER — Emergency Department (HOSPITAL_BASED_OUTPATIENT_CLINIC_OR_DEPARTMENT_OTHER)
Admission: EM | Admit: 2018-07-20 | Discharge: 2018-07-20 | Disposition: A | Discharge status: Home / Self Care | Payer: Self-pay | Attending: Emergency Medicine | Admitting: Emergency Medicine

## 2018-07-20 ENCOUNTER — Encounter (HOSPITAL_BASED_OUTPATIENT_CLINIC_OR_DEPARTMENT_OTHER): Payer: Self-pay | Admitting: Emergency Medicine

## 2018-07-20 ENCOUNTER — Other Ambulatory Visit: Payer: Self-pay

## 2018-07-20 DIAGNOSIS — M79661 Pain in right lower leg: Secondary | ICD-10-CM | POA: Insufficient documentation

## 2018-07-20 DIAGNOSIS — M7121 Synovial cyst of popliteal space [Baker], right knee: Secondary | ICD-10-CM | POA: Insufficient documentation

## 2018-07-20 MED ORDER — MELOXICAM 15 MG PO TABS: 15.0000 mg | ORAL_TABLET | Freq: Every day | ORAL | 0 refills | Status: AC

## 2018-07-20 NOTE — ED Triage Notes (Signed)
Patient states that she has had a pain to the back of her knee since last week - she notices a lump earlier this week. The patient walks with a limp

## 2018-07-20 NOTE — ED Provider Notes (Signed)
[facility] EMERGENCY DEPARTMENT Provider Note   CSN: [id number] Arrival date & time: 07/20/18  1526     History   Chief Complaint Chief Complaint  Patient presents with  . Knee Pain    HPI Adrienne Fletcher is a 47 y.o. female.  Who presents the emergency department chief complaint of right knee pain.  Patient states that she developed pain and swelling in the right knee and now has a painful swollen area in the back of her right knee over the past 2 days.  It is worse when she tries to deeply flex the knee and she is limping on it.  She tried Tylenol without significant relief.  She has no significant history with her knee or history of injury to the knee.  HPI  History reviewed. No pertinent past medical history.  Patient Active Problem List   Diagnosis Date Noted  . 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 CHEST PAIN 07/19/2010    History reviewed. No pertinent surgical history.   OB History   None      Home Medications    Prior to Admission medications   Medication Sig Start Date End Date Taking? Authorizing Provider  acetaminophen-codeine (TYLENOL #3) 300-30 MG tablet Take 1-2 tablets by mouth every 6 (six) hours as needed for moderate pain. 02/18/16   Fayrene Helper, PA-C  clindamycin (CLEOCIN) 300 MG capsule Take 1 capsule (300 mg total) by mouth 4 (four) times daily. X 7 days 02/24/16   Geoffery Lyons, MD  HYDROcodone-acetaminophen (NORCO) 5-325 MG tablet Take 1-2 tablets by mouth every 6 (six) hours as needed. 02/24/16   Geoffery Lyons, MD  meloxicam (MOBIC) 15 MG tablet Take 1 tablet (15 mg total) by mouth daily. 07/20/18   Arthor Captain, PA-C    Family History History reviewed. No pertinent family history.  Social History Social History   Tobacco Use  . Smoking status: Never Smoker  . Smokeless tobacco: Never Used  Substance Use Topics  . Alcohol use: No  . Drug use: No     Allergies   Penicillins and Sulfonamide derivatives   Review of Systems Review of  Systems Ten systems reviewed and are negative for acute change, except as noted in the HPI.    Physical Exam Updated Vital Signs BP 136/90 (BP Location: Left Arm)   Pulse 70   Temp 98.5 F (36.9 C) (Oral)   Resp 18   Ht 5\' 7"  (1.702 m)   Wt 95.3 kg   LMP 06/25/2018   SpO2 100%   BMI 32.89 kg/m   Physical Exam  Physical Exam  Nursing note and vitals reviewed. Constitutional: She is oriented to person, place, and time. She appears well-developed and well-nourished. No distress.  HENT:  Head: Normocephalic and atraumatic.  Eyes: Conjunctivae normal and EOM are normal. Pupils are equal, round, and reactive to light. No scleral icterus.  Neck: Normal range of motion.  Cardiovascular: Normal rate, regular rhythm and normal heart sounds.  Exam reveals no gallop and no friction rub.   No murmur heard. Pulmonary/Chest: Effort normal and breath sounds normal. No respiratory distress.  Abdominal: Soft. Bowel sounds are normal. She exhibits no distension and no mass. There is no tenderness. There is no guarding.  Neurological: She is alert and oriented to person, place, and time.  Musculoskeletal: Minor effusion noted on the right knee.  There is palpable swelling in the popliteal fossa consistent with Baker's cyst.  Movements are stable to confrontation. Skin: Skin is warm and dry.  She is not diaphoretic.    ED Treatments / Results  Labs (all labs ordered are listed, but only abnormal results are displayed) Labs Reviewed - No data to display  EKG None  Radiology US Venous Img Lower Right (dvt Study)  Result Date: 07/20/2018 CLINICAL DATA:  RIGHT posterior knee pain and swelling for 1 week. EXAM: RIGHT LOWER EXTREMITY VENOUS DOPPLER ULTRASOUND TECHNIQUE: Gray-scale sonography with graded compression, as well as color Doppler and duplex ultrasound were performed to evaluate the lower extremity deep venous systems from the level of the common femoral vein and including the common  femoral, femoral, profunda femoral, popliteal and calf veins including the posterior tibial, peroneal and gastrocnemius veins when visible. The superficial great saphenous vein was also interrogated. Spectral Doppler was utilized to evaluate flow at rest and with distal augmentation maneuvers in the common femoral, femoral and popliteal veins. COMPARISON:  None. FINDINGS: Contralateral Common Femoral Vein: Respiratory phasicity is normal and symmetric with the symptomatic side. No evidence of thrombus. Normal compressibility. Common Femoral Vein: No evidence of thrombus. Normal compressibility, respiratory phasicity and response to augmentation. Saphenofemoral Junction: No evidence of thrombus. Normal compressibility and flow on color Doppler imaging. Profunda Femoral Vein: No evidence of thrombus. Normal compressibility and flow on color Doppler imaging. Femoral Vein: No evidence of thrombus. Normal compressibility, respiratory phasicity and response to augmentation. Popliteal Vein: No evidence of thrombus. Normal compressibility, respiratory phasicity and response to augmentation. Calf Veins: No evidence of thrombus. Normal compressibility and flow on color Doppler imaging. Superficial Great Saphenous Vein: No evidence of thrombus. Normal compressibility. Venous Reflux:  None. Other Findings: Within the MEDIAL aspect of the RIGHT popliteal fossa, there is a simple fluid collection which measures 9.3 x 1.8 x 4.6 centimeters. IMPRESSION: 1. No evidence for acute deep vein thrombosis. 2. 9.3 centimeter RIGHT Baker cyst. Electronically Signed   By: Norva Pavlov M.D.   On: 07/20/2018 17:09   Dg Knee Complete 4 Views Right  Result Date: 07/20/2018 CLINICAL DATA:  Pt states that she felt a lump on the backside of her right knee about a week ago. Pt also complaining of an achy pain in the right knee which radiates down into her leg. No known injury. EXAM: RIGHT KNEE - COMPLETE 4+ VIEW COMPARISON:  None. FINDINGS:  No fracture.  No bone lesion. Small marginal osteophytes from the medial joint space compartment. No other degenerative change. No joint effusion. Soft tissues are unremarkable. IMPRESSION: 1. Fracture or acute finding. 2. Minor medial joint space compartment osteoarthritis. Electronically Signed   By: Amie Portland M.D.   On: 07/20/2018 17:10    Procedures Procedures (including critical care time)  Medications Ordered in ED Medications - No data to display   Initial Impression / Assessment and Plan / ED Course  I have reviewed the triage vital signs and the nursing notes.  Pertinent labs & imaging results that were available during my care of the patient were reviewed by me and considered in my medical decision making (see chart for details).    Patient x-ray negative.  I personally reviewed the films and agree with radiologic interpretation.  DVT study is also negative but does show Baker's cyst.  Patient given a knee sleeve.  She will be discharged with NSAIDs.  Patient is also given referral to outpatient sports medicine for further management.  I discussed return precautions with the patient.  She is ambulatory.  She appears appropriate for discharge at this time  Final Clinical Impressions(s) /  ED Diagnoses   Final diagnoses:  Baker's cyst of knee, right    ED Discharge Orders         Ordered    meloxicam (MOBIC) 15 MG tablet  Daily     07/20/18 1736           Arthor Captain, PA-C 07/20/18 2018    Loren Racer, MD 07/21/18 1744

## 2018-07-20 NOTE — Discharge Instructions (Signed)
Get help right away if: You have sudden or worsening pain and swelling in your calf area. Your have chest pain or shortness of breath.

## 2021-02-09 ENCOUNTER — Encounter (HOSPITAL_BASED_OUTPATIENT_CLINIC_OR_DEPARTMENT_OTHER): Payer: Self-pay | Admitting: Emergency Medicine

## 2021-02-09 ENCOUNTER — Other Ambulatory Visit: Payer: Self-pay

## 2021-02-09 ENCOUNTER — Emergency Department (HOSPITAL_BASED_OUTPATIENT_CLINIC_OR_DEPARTMENT_OTHER): Payer: Self-pay

## 2021-02-09 ENCOUNTER — Emergency Department (HOSPITAL_BASED_OUTPATIENT_CLINIC_OR_DEPARTMENT_OTHER)
Admission: EM | Admit: 2021-02-09 | Discharge: 2021-02-09 | Disposition: A | Discharge status: Home / Self Care | Payer: Self-pay | Attending: Emergency Medicine | Admitting: Emergency Medicine

## 2021-02-09 DIAGNOSIS — Y92211 Elementary school as the place of occurrence of the external cause: Secondary | ICD-10-CM | POA: Insufficient documentation

## 2021-02-09 DIAGNOSIS — Y990 Civilian activity done for income or pay: Secondary | ICD-10-CM | POA: Insufficient documentation

## 2021-02-09 DIAGNOSIS — S46912A Strain of unspecified muscle, fascia and tendon at shoulder and upper arm level, left arm, initial encounter: Secondary | ICD-10-CM | POA: Insufficient documentation

## 2021-02-09 MED ORDER — ACETAMINOPHEN 325 MG PO TABS
650.0000 mg | ORAL_TABLET | Freq: Once | ORAL | Status: AC
  Administered 2021-02-09: 650 mg via ORAL
  Filled 2021-02-09: qty 2

## 2021-02-09 NOTE — Discharge Instructions (Signed)
Ms. Adrienne Fletcher,  It was a pleasure seeing you in the Emergency Department. We obtained an x-ray of your left shoulder which did not show any fracture or dislocation. You are medically stable to return to work on 02/10/2021. If you have any new or worsening symptoms, please present back to the Emergency Department for evaluation.  Sincerely, Dr. Jasmine December, MD
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
